# Patient Record
Sex: Male | Born: 1985
Health system: Southern US, Community
[De-identification: ages and names within clinical notes are randomized; demographics above are authoritative.]

## PROBLEM LIST (undated history)

## (undated) DIAGNOSIS — J45909 Unspecified asthma, uncomplicated: Secondary | ICD-10-CM

## (undated) HISTORY — PX: TONSILLECTOMY: SUR1361

---
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
## 2019-01-31 ENCOUNTER — Other Ambulatory Visit: Payer: Self-pay

## 2019-01-31 ENCOUNTER — Ambulatory Visit (HOSPITAL_COMMUNITY)
Admission: EM | Admit: 2019-01-31 | Discharge: 2019-01-31 | Disposition: A | Discharge status: Home / Self Care | Payer: 59 | Attending: Family Medicine | Admitting: Family Medicine

## 2019-01-31 ENCOUNTER — Encounter (HOSPITAL_COMMUNITY): Payer: Self-pay | Admitting: Emergency Medicine

## 2019-01-31 DIAGNOSIS — R1030 Lower abdominal pain, unspecified: Secondary | ICD-10-CM

## 2019-01-31 HISTORY — DX: Unspecified asthma, uncomplicated: J45.909

## 2019-01-31 LAB — POCT URINALYSIS DIP (DEVICE)
Bilirubin Urine: NEGATIVE
Glucose, UA: NEGATIVE mg/dL
Hgb urine dipstick: NEGATIVE
Ketones, ur: NEGATIVE mg/dL
Leukocytes,Ua: NEGATIVE
Nitrite: NEGATIVE
Protein, ur: NEGATIVE mg/dL
Specific Gravity, Urine: <=1.005 (ref 1.005–1.030)
Urobilinogen, UA: 0.2 mg/dL (ref 0.0–1.0)
pH: 6 (ref 5.0–8.0)

## 2019-01-31 MED ORDER — CIPROFLOXACIN HCL 500 MG PO TABS: 500.0000 mg | ORAL_TABLET | Freq: Two times a day (BID) | ORAL | 0 refills | Status: DC

## 2019-01-31 NOTE — ED Triage Notes (Signed)
Pt reports mid lower abdominal cramping with shooting pain that started on Monday.  He reports moments of urgency and frequency and one time he had a little pressure when trying to urinate.

## 2019-01-31 NOTE — ED Provider Notes (Signed)
[place]    CSN: [id number] Arrival date & time: 01/31/19  1433      History   Chief Complaint Chief Complaint  Patient presents with   Abdominal Pain    HPI Hector Elliott is a 33 y.o. male.   HPI  Patient states he has lower abdominal pressure and pain.  Urinary frequency.  Feels like he cannot quite empty his bladder.  He states that he feels some pressure towards his low back.  No fever chills.  He states that he has to push to empty his bladder.  He is never had bladder problems.  Never had sexually transmitted disease.  Happily married.  Never had prostate problems.  Past Medical History:  Diagnosis Date   Asthma    childhood    There are no active problems to display for this patient.   Past Surgical History:  Procedure Laterality Date   TONSILLECTOMY         Home Medications    Prior to Admission medications   Medication Sig Start Date End Date Taking? Authorizing Provider  ciprofloxacin (CIPRO) 500 MG tablet Take 1 tablet (500 mg total) by mouth 2 (two) times daily. 01/31/19   Raylene Everts, MD    Family History Family History  Problem Relation Age of Onset   Hypertension Mother    Healthy Father     Social History Social History   Tobacco Use   Smoking status: Current Every Day Smoker    Packs/day: 1.00    Types: Cigarettes   Smokeless tobacco: Never Used  Substance Use Topics   Alcohol use: Yes   Drug use: Never     Allergies   Patient has no known allergies.   Review of Systems Review of Systems  Constitutional: Negative for chills and fever.  HENT: Negative for ear pain and sore throat.   Eyes: Negative for pain and visual disturbance.  Respiratory: Negative for cough and shortness of breath.   Cardiovascular: Negative for chest pain and palpitations.  Gastrointestinal: Negative for abdominal pain and vomiting.  Genitourinary: Positive for difficulty urinating, frequency and urgency. Negative  for dysuria and hematuria.  Musculoskeletal: Negative for arthralgias and back pain.  Skin: Negative for color change and rash.  Neurological: Negative for seizures and syncope.  All other systems reviewed and are negative.    Physical Exam Triage Vital Signs ED Triage Vitals  Enc Vitals Group     BP 01/31/19 1510 130/87     Pulse Rate 01/31/19 1510 78     Resp 01/31/19 1510 12     Temp 01/31/19 1510 98.6 F (37 C)     Temp Source 01/31/19 1510 Oral     SpO2 01/31/19 1510 100 %     Weight --      Height --      Head Circumference --      Peak Flow --      Pain Score 01/31/19 1513 4     Pain Loc --      Pain Edu? --      Excl. in [place]? --    No data found.  Updated Vital Signs BP 130/87 (BP Location: Right Arm)    Pulse 78    Temp 98.6 F (37 C) (Oral)    Resp 12    SpO2 100%   Visual Acuity Right Eye Distance:   Left Eye Distance:   Bilateral Distance:    Right Eye Near:   Left Eye  Near:    Bilateral Near:     Physical Exam Constitutional:      General: He is not in acute distress.    Appearance: He is well-developed.  HENT:     Head: Normocephalic and atraumatic.     Mouth/Throat:     Mouth: Mucous membranes are moist.  Eyes:     Conjunctiva/sclera: Conjunctivae normal.     Pupils: Pupils are equal, round, and reactive to light.  Neck:     Musculoskeletal: Normal range of motion.  Cardiovascular:     Rate and Rhythm: Normal rate and regular rhythm.     Heart sounds: Normal heart sounds.  Pulmonary:     Effort: Pulmonary effort is normal. No respiratory distress.     Breath sounds: Normal breath sounds.  Abdominal:     General: Abdomen is flat. Bowel sounds are normal. There is no distension.     Palpations: Abdomen is soft.     Tenderness: There is no abdominal tenderness.     Comments: States he feels "pressure" fullness when I palpate suprapubic area  Musculoskeletal: Normal range of motion.  Skin:    General: Skin is warm and dry.  Neurological:      General: No focal deficit present.     Mental Status: He is alert.  Psychiatric:        Mood and Affect: Mood normal.        Behavior: Behavior normal.      UC Treatments / Results  Labs (all labs ordered are listed, but only abnormal results are displayed) Labs Reviewed  POCT URINALYSIS DIP (DEVICE)    EKG   Radiology No results found.  Procedures Procedures (including critical care time)  Medications Ordered in UC Medications - No data to display  Initial Impression / Assessment and Plan / UC Course  I have reviewed the triage vital signs and the nursing notes.  Pertinent labs & imaging results that were available during my care of the patient were reviewed by me and considered in my medical decision making (see chart for details).  Clinical Course as of Jan 31 2115  Thu Jan 31, 2019  1545 POCT Urinalysis, Dipstick [YN]  1550 POCT Urinalysis, Dipstick [YN]    Clinical Course User Index [YN] Eustace MooreNelson, Brevon Dewald Sue, MD    Urinalysis is normal.  His symptoms are most consistent with a prostate infection.  Medical and treated with antibiotics and expect improvement over the next few days.  He is told that he needs to see his PCP or return if not improving by Monday Final Clinical Impressions(s) / UC Diagnoses   Final diagnoses:  Lower abdominal pain     Discharge Instructions     I believe your lower abdominal pain is likely from a urinary tract/prostate I am going to prescribe antibiotics 2 times a day Plenty of fluids and water You should see improvement 2 to 3 days Call if no better by Monday    ED Prescriptions    Medication Sig Dispense Auth. Provider   ciprofloxacin (CIPRO) 500 MG tablet Take 1 tablet (500 mg total) by mouth 2 (two) times daily. 14 tablet Eustace MooreNelson, Lorriane Dehart Sue, MD     Controlled Substance Prescriptions [place] Controlled Substance Registry consulted? Not Applicable   Eustace MooreNelson, Iyania Denne Sue, MD 01/31/19 2118

## 2019-01-31 NOTE — Discharge Instructions (Signed)
I believe your lower abdominal pain is likely from a urinary tract/prostate I am going to prescribe antibiotics 2 times a day Plenty of fluids and water You should see improvement 2 to 3 days Call if no better by Monday

## 2020-01-28 ENCOUNTER — Other Ambulatory Visit: Payer: Self-pay

## 2020-01-28 ENCOUNTER — Ambulatory Visit (INDEPENDENT_AMBULATORY_CARE_PROVIDER_SITE_OTHER): Payer: No Typology Code available for payment source | Admitting: Family

## 2020-01-28 ENCOUNTER — Encounter: Payer: Self-pay | Admitting: Family

## 2020-01-28 VITALS — BP 128/75 | HR 80 | Temp 98.1°F | Ht 72.0 in | Wt 196.0 lb

## 2020-01-28 DIAGNOSIS — Z0000 Encounter for general adult medical examination without abnormal findings: Secondary | ICD-10-CM

## 2020-01-28 DIAGNOSIS — E663 Overweight: Secondary | ICD-10-CM | POA: Diagnosis not present

## 2020-01-28 DIAGNOSIS — Z0001 Encounter for general adult medical examination with abnormal findings: Secondary | ICD-10-CM | POA: Diagnosis not present

## 2020-01-28 DIAGNOSIS — F17200 Nicotine dependence, unspecified, uncomplicated: Secondary | ICD-10-CM

## 2020-01-28 NOTE — Progress Notes (Signed)
Subjective:    Patient ID: Hector Elliott, male    DOB: 1986/01/31, 34 y.o.   MRN: [id number]  Chief Complaint  Patient presents with  . Establish Care    HPI  Pt presents to the office today to establish care. Pt currently not taking any medications. Pt denies any headache, palpitations, SOB, or edema at this time.   Denies TDAP today.    Review of Systems  All other systems reviewed and are negative.   Family History  Problem Relation Age of Onset  . Hypertension Mother   . Healthy Father    Social History   Socioeconomic History  . Marital status: Married    Spouse name: Not on file  . Number of children: 1  . Years of education: Not on file  . Highest education level: Not on file  Occupational History  . Not on file  Tobacco Use  . Smoking status: Current Every Day Smoker    Packs/day: 1.00    Types: Cigarettes  . Smokeless tobacco: Never Used  Vaping Use  . Vaping Use: Never used  Substance and Sexual Activity  . Alcohol use: Yes    Alcohol/week: 5.0 standard drinks    Types: 5 Cans of beer per week  . Drug use: Never  . Sexual activity: Not on file  Other Topics Concern  . Not on file  Social History Narrative  . Not on file   Social Determinants of Health   Financial Resource Strain:   . Difficulty of Paying Living Expenses:   Food Insecurity:   . Worried About Charity fundraiser in the Last Year:   . Arboriculturist in the Last Year:   Transportation Needs:   . Film/video editor (Medical):   Marland Kitchen Lack of Transportation (Non-Medical):   Physical Activity:   . Days of Exercise per Week:   . Minutes of Exercise per Session:   Stress:   . Feeling of Stress :   Social Connections:   . Frequency of Communication with Friends and Family:   . Frequency of Social Gatherings with Friends and Family:   . Attends Religious Services:   . Active Member of Clubs or Organizations:   . Attends Archivist Meetings:   Marland Kitchen Marital  Status:        Objective:   Physical Exam Vitals reviewed.  Constitutional:      General: He is not in acute distress.    Appearance: He is well-developed.  HENT:     Head: Normocephalic.     Right Ear: Tympanic membrane normal.     Left Ear: Tympanic membrane normal.  Eyes:     General:        Right eye: No discharge.        Left eye: No discharge.     Pupils: Pupils are equal, round, and reactive to light.  Neck:     Thyroid: No thyromegaly.  Cardiovascular:     Rate and Rhythm: Normal rate and regular rhythm.     Heart sounds: Normal heart sounds. No murmur heard.   Pulmonary:     Effort: Pulmonary effort is normal. No respiratory distress.     Breath sounds: Normal breath sounds. No wheezing.  Abdominal:     General: Bowel sounds are normal. There is no distension.     Palpations: Abdomen is soft.     Tenderness: There is no abdominal tenderness.  Musculoskeletal:  General: No tenderness. Normal range of motion.     Cervical back: Normal range of motion and neck supple.  Skin:    General: Skin is warm and dry.     Findings: No erythema or rash.  Neurological:     Mental Status: He is alert and oriented to person, place, and time.     Cranial Nerves: No cranial nerve deficit.     Deep Tendon Reflexes: Reflexes are normal and symmetric.  Psychiatric:        Behavior: Behavior normal.        Thought Content: Thought content normal.        Judgment: Judgment normal.        BP 128/75   Pulse 80   Temp 98.1 F (36.7 C)   Ht 6' (1.829 m)   Wt 196 lb (88.9 kg)   SpO2 99%   BMI 26.58 kg/m   Assessment & Plan:  Hector Elliott comes in today with chief complaint of Establish Care   Diagnosis and orders addressed:  1. Annual physical exam - CMP14+EGFR - CBC with Differential/Platelet - Lipid panel - PSA, total and free - TSH - Testosterone,Free and Total  2. Current smoker Smoking cessation discussed - CMP14+EGFR  3. Overweight (BMI  25.0-29.9) - CMP14+EGFR   Labs pending Health Maintenance reviewed Diet and exercise encouraged  Follow up plan: 1 year    Evelina Dun, FNP

## 2020-01-28 NOTE — Patient Instructions (Signed)

## 2020-02-01 LAB — LIPID PANEL
Chol/HDL Ratio: 5 ratio (ref 0.0–5.0)
Cholesterol, Total: 174 mg/dL (ref 100–199)
HDL: 35 mg/dL — ABNORMAL LOW (ref >39)
LDL Chol Calc (NIH): 100 mg/dL — ABNORMAL HIGH (ref 0–99)
Triglycerides: 224 mg/dL — ABNORMAL HIGH (ref 0–149)
VLDL Cholesterol Cal: 39 mg/dL (ref 5–40)

## 2020-02-01 LAB — CBC WITH DIFFERENTIAL/PLATELET
Basophils Absolute: 0.1 10*3/uL (ref 0.0–0.2)
Basos: 1 %
EOS (ABSOLUTE): 0.1 10*3/uL (ref 0.0–0.4)
Eos: 2 %
Hematocrit: 53.7 % — ABNORMAL HIGH (ref 37.5–51.0)
Hemoglobin: 18.4 g/dL — ABNORMAL HIGH (ref 13.0–17.7)
Immature Grans (Abs): 0 10*3/uL (ref 0.0–0.1)
Immature Granulocytes: 0 %
Lymphocytes Absolute: 2.8 10*3/uL (ref 0.7–3.1)
Lymphs: 31 %
MCH: 30.4 pg (ref 26.6–33.0)
MCHC: 34.3 g/dL (ref 31.5–35.7)
MCV: 89 fL (ref 79–97)
Monocytes Absolute: 0.8 10*3/uL (ref 0.1–0.9)
Monocytes: 8 %
Neutrophils Absolute: 5.2 10*3/uL (ref 1.4–7.0)
Neutrophils: 58 %
Platelets: 256 10*3/uL (ref 150–450)
RBC: 6.05 x10E6/uL — ABNORMAL HIGH (ref 4.14–5.80)
RDW: 12.7 % (ref 11.6–15.4)
WBC: 9 10*3/uL (ref 3.4–10.8)

## 2020-02-01 LAB — CMP14+EGFR
ALT: 27 IU/L (ref 0–44)
AST: 20 IU/L (ref 0–40)
Albumin/Globulin Ratio: 1.8 (ref 1.2–2.2)
Albumin: 4.5 g/dL (ref 4.0–5.0)
Alkaline Phosphatase: 143 IU/L — ABNORMAL HIGH (ref 48–121)
BUN/Creatinine Ratio: 11 (ref 9–20)
BUN: 12 mg/dL (ref 6–20)
Bilirubin Total: 0.4 mg/dL (ref 0.0–1.2)
CO2: 22 mmol/L (ref 20–29)
Calcium: 9.7 mg/dL (ref 8.7–10.2)
Chloride: 102 mmol/L (ref 96–106)
Creatinine, Ser: 1.13 mg/dL (ref 0.76–1.27)
GFR calc Af Amer: 99 mL/min/{1.73_m2} (ref >59)
GFR calc non Af Amer: 86 mL/min/{1.73_m2} (ref >59)
Globulin, Total: 2.5 g/dL (ref 1.5–4.5)
Glucose: 98 mg/dL (ref 65–99)
Potassium: 4.3 mmol/L (ref 3.5–5.2)
Sodium: 139 mmol/L (ref 134–144)
Total Protein: 7 g/dL (ref 6.0–8.5)

## 2020-02-01 LAB — TESTOSTERONE,FREE AND TOTAL
Testosterone, Free: 12.4 pg/mL (ref 8.7–25.1)
Testosterone: 1060 ng/dL — ABNORMAL HIGH (ref 264–916)

## 2020-02-01 LAB — PSA, TOTAL AND FREE
PSA, Free Pct: 38.6 %
PSA, Free: 0.27 ng/mL
Prostate Specific Ag, Serum: 0.7 ng/mL (ref 0.0–4.0)

## 2020-02-01 LAB — TSH: TSH: 2.78 u[IU]/mL (ref 0.450–4.500)

## 2020-10-06 ENCOUNTER — Ambulatory Visit: Payer: No Typology Code available for payment source | Admitting: Family Medicine

## 2020-10-07 ENCOUNTER — Other Ambulatory Visit: Payer: Self-pay

## 2020-10-07 ENCOUNTER — Ambulatory Visit (INDEPENDENT_AMBULATORY_CARE_PROVIDER_SITE_OTHER): Payer: No Typology Code available for payment source

## 2020-10-07 ENCOUNTER — Ambulatory Visit (INDEPENDENT_AMBULATORY_CARE_PROVIDER_SITE_OTHER): Payer: No Typology Code available for payment source | Admitting: Family Medicine

## 2020-10-07 VITALS — BP 131/76 | HR 91 | Temp 98.0°F | Ht 72.0 in | Wt 197.0 lb

## 2020-10-07 DIAGNOSIS — M25512 Pain in left shoulder: Secondary | ICD-10-CM

## 2020-10-07 DIAGNOSIS — G8929 Other chronic pain: Secondary | ICD-10-CM

## 2020-10-07 NOTE — Progress Notes (Signed)
   Assessment & Plan:  1. Chronic left shoulder pain Exercises provided for patient to complete at home as he is unable to complete physical therapy in an office due to his work schedule.  Ibuprofen scheduled for pain relief.  Discussed he may need a MRI in the future but we need to try physical therapy first and get an x-ray today. - DG Shoulder Left   Follow up plan: Return in about 6 weeks (around 11/18/2020) for f/u shoulder pain.  Deliah Boston, MSN, APRN, FNP-C [facility]  Subjective:   Patient ID: Hector Elliott, male    DOB: [birth year], 35 y.o.   MRN: [id number]  HPI: Hector Elliott is a 35 y.o. male presenting on 10/07/2020 for Shoulder Pain  Patient reports left shoulder pain for the past 6 months.  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  States he has been hard on his body and has had many injuries to his shoulders in the past.  Reports he has torn something in his shoulder in the past as well.  Most days describes the pain as a dull nagging pain.  Past 2 days he has had a sharp pain that goes from his shoulder to his neck when he turns his head to the right.  He took ibuprofen yesterday which he feels reducing swelling he was having but did not help with the pain.  He reports he is back to his baseline pain today.   ROS: Negative unless specifically indicated above in HPI.   Relevant past medical history reviewed and updated as indicated.   Allergies and medications reviewed and updated.  No current outpatient medications on file.  No Known Allergies  Objective:   There were no vitals taken for this visit.   Physical Exam Vitals reviewed.  Constitutional:      General: He is not in acute distress.    Appearance: Normal appearance. He is not ill-appearing, toxic-appearing or diaphoretic.  HENT:     Head: Normocephalic and atraumatic.  Eyes:     General: No scleral icterus.       Right eye: No discharge.         Left eye: No discharge.     Conjunctiva/sclera: Conjunctivae normal.  Cardiovascular:     Rate and Rhythm: Normal rate.  Pulmonary:     Effort: Pulmonary effort is normal. No respiratory distress.  Musculoskeletal:        General: Normal range of motion.     Left shoulder: Bony tenderness and crepitus present. No swelling, deformity, effusion, laceration or tenderness. Normal range of motion. Normal strength. Normal pulse.     Cervical back: Normal range of motion.  Skin:    General: Skin is warm and dry.  Neurological:     Mental Status: He is alert and oriented to person, place, and time. Mental status is at baseline.  Psychiatric:        Mood and Affect: Mood normal.        Behavior: Behavior normal.        Thought Content: Thought content normal.        Judgment: Judgment normal.

## 2020-10-07 NOTE — Patient Instructions (Signed)
Shoulder Exercises Ask your health care provider which exercises are safe for you. Do exercises exactly as told by your health care provider and adjust them as directed. It is normal to feel mild stretching, pulling, tightness, or discomfort as you do these exercises. Stop right away if you feel sudden pain or your pain gets worse. Do not begin these exercises until told by your health care provider. Stretching exercises External rotation and abduction This exercise is sometimes called corner stretch. This exercise rotates your arm outward (external rotation) and moves your arm out from your body (abduction). 1. Stand in a doorway with one of your feet slightly in front of the other. This is called a staggered stance. If you cannot reach your forearms to the door frame, stand facing a corner of a room. 2. Choose one of the following positions as told by your health care provider: ? Place your hands and forearms on the door frame above your head. ? Place your hands and forearms on the door frame at the height of your head. ? Place your hands on the door frame at the height of your elbows. 3. Slowly move your weight onto your front foot until you feel a stretch across your chest and in the front of your shoulders. Keep your head and chest upright and keep your abdominal muscles tight. 4. Hold for __________ seconds. 5. To release the stretch, shift your weight to your back foot. Repeat __________ times. Complete this exercise __________ times a day.   Extension, standing 1. Stand and hold a broomstick, a cane, or a similar object behind your back. ? Your hands should be a little wider than shoulder width apart. ? Your palms should face away from your back. 2. Keeping your elbows straight and your shoulder muscles relaxed, move the stick away from your body until you feel a stretch in your shoulders (extension). ? Avoid shrugging your shoulders while you move the stick. Keep your shoulder blades  tucked down toward the middle of your back. 3. Hold for __________ seconds. 4. Slowly return to the starting position. Repeat __________ times. Complete this exercise __________ times a day. Range-of-motion exercises Pendulum 1. Stand near a wall or a surface that you can hold onto for balance. 2. Bend at the waist and let your left / right arm hang straight down. Use your other arm to support you. Keep your back straight and do not lock your knees. 3. Relax your left / right arm and shoulder muscles, and move your hips and your trunk so your left / right arm swings freely. Your arm should swing because of the motion of your body, not because you are using your arm or shoulder muscles. 4. Keep moving your hips and trunk so your arm swings in the following directions, as told by your health care provider: ? Side to side. ? Forward and backward. ? In clockwise and counterclockwise circles. 5. Continue each motion for __________ seconds, or for as long as told by your health care provider. 6. Slowly return to the starting position. Repeat __________ times. Complete this exercise __________ times a day.   Shoulder flexion, standing 1. Stand and hold a broomstick, a cane, or a similar object. Place your hands a little more than shoulder width apart on the object. Your left / right hand should be palm up, and your other hand should be palm down. 2. Keep your elbow straight and your shoulder muscles relaxed. Push the stick up with your healthy   arm to raise your left / right arm in front of your body, and then over your head until you feel a stretch in your shoulder (flexion). ? Avoid shrugging your shoulder while you raise your arm. Keep your shoulder blade tucked down toward the middle of your back. 3. Hold for __________ seconds. 4. Slowly return to the starting position. Repeat __________ times. Complete this exercise __________ times a day.   Shoulder abduction, standing 1. Stand and hold a  broomstick, a cane, or a similar object. Place your hands a little more than shoulder width apart on the object. Your left / right hand should be palm up, and your other hand should be palm down. 2. Keep your elbow straight and your shoulder muscles relaxed. Push the object across your body toward your left / right side. Raise your left / right arm to the side of your body (abduction) until you feel a stretch in your shoulder. ? Do not raise your arm above shoulder height unless your health care provider tells you to do that. ? If directed, raise your arm over your head. ? Avoid shrugging your shoulder while you raise your arm. Keep your shoulder blade tucked down toward the middle of your back. 3. Hold for __________ seconds. 4. Slowly return to the starting position. Repeat __________ times. Complete this exercise __________ times a day. Internal rotation 1. Place your left / right hand behind your back, palm up. 2. Use your other hand to dangle an exercise band, a towel, or a similar object over your shoulder. Grasp the band with your left / right hand so you are holding on to both ends. 3. Gently pull up on the band until you feel a stretch in the front of your left / right shoulder. The movement of your arm toward the center of your body is called internal rotation. ? Avoid shrugging your shoulder while you raise your arm. Keep your shoulder blade tucked down toward the middle of your back. 4. Hold for __________ seconds. 5. Release the stretch by letting go of the band and lowering your hands. Repeat __________ times. Complete this exercise __________ times a day.   Strengthening exercises External rotation 1. Sit in a stable chair without armrests. 2. Secure an exercise band to a stable object at elbow height on your left / right side. 3. Place a soft object, such as a folded towel or a small pillow, between your left / right upper arm and your body to move your elbow about 4 inches (10  cm) away from your side. 4. Hold the end of the exercise band so it is tight and there is no slack. 5. Keeping your elbow pressed against the soft object, slowly move your forearm out, away from your abdomen (external rotation). Keep your body steady so only your forearm moves. 6. Hold for __________ seconds. 7. Slowly return to the starting position. Repeat __________ times. Complete this exercise __________ times a day.   Shoulder abduction 1. Sit in a stable chair without armrests, or stand up. 2. Hold a __________ weight in your left / right hand, or hold an exercise band with both hands. 3. Start with your arms straight down and your left / right palm facing in, toward your body. 4. Slowly lift your left / right hand out to your side (abduction). Do not lift your hand above shoulder height unless your health care provider tells you that this is safe. ? Keep your arms straight. ? Avoid   shrugging your shoulder while you do this movement. Keep your shoulder blade tucked down toward the middle of your back. 5. Hold for __________ seconds. 6. Slowly lower your arm, and return to the starting position. Repeat __________ times. Complete this exercise __________ times a day.   Shoulder extension 1. Sit in a stable chair without armrests, or stand up. 2. Secure an exercise band to a stable object in front of you so it is at shoulder height. 3. Hold one end of the exercise band in each hand. Your palms should face each other. 4. Straighten your elbows and lift your hands up to shoulder height. 5. Step back, away from the secured end of the exercise band, until the band is tight and there is no slack. 6. Squeeze your shoulder blades together as you pull your hands down to the sides of your thighs (extension). Stop when your hands are straight down by your sides. Do not let your hands go behind your body. 7. Hold for __________ seconds. 8. Slowly return to the starting position. Repeat __________  times. Complete this exercise __________ times a day. Shoulder row 1. Sit in a stable chair without armrests, or stand up. 2. Secure an exercise band to a stable object in front of you so it is at waist height. 3. Hold one end of the exercise band in each hand. Position your palms so that your thumbs are facing the ceiling (neutral position). 4. Bend each of your elbows to a 90-degree angle (right angle) and keep your upper arms at your sides. 5. Step back until the band is tight and there is no slack. 6. Slowly pull your elbows back behind you. 7. Hold for __________ seconds. 8. Slowly return to the starting position. Repeat __________ times. Complete this exercise __________ times a day. Shoulder press-ups 1. Sit in a stable chair that has armrests. Sit upright, with your feet flat on the floor. 2. Put your hands on the armrests so your elbows are bent and your fingers are pointing forward. Your hands should be about even with the sides of your body. 3. Push down on the armrests and use your arms to lift yourself off the chair. Straighten your elbows and lift yourself up as much as you comfortably can. ? Move your shoulder blades down, and avoid letting your shoulders move up toward your ears. ? Keep your feet on the ground. As you get stronger, your feet should support less of your body weight as you lift yourself up. 4. Hold for __________ seconds. 5. Slowly lower yourself back into the chair. Repeat __________ times. Complete this exercise __________ times a day.   Wall push-ups 1. Stand so you are facing a stable wall. Your feet should be about one arm-length away from the wall. 2. Lean forward and place your palms on the wall at shoulder height. 3. Keep your feet flat on the floor as you bend your elbows and lean forward toward the wall. 4. Hold for __________ seconds. 5. Straighten your elbows to push yourself back to the starting position. Repeat __________ times. Complete this  exercise __________ times a day.   This information is not intended to replace advice given to you by your health care provider. Make sure you discuss any questions you have with your health care provider. Document Revised: 11/09/2018 Document Reviewed: 08/17/2018 Elsevier Patient Education  2021 [employer]  

## 2022-04-13 ENCOUNTER — Telehealth: Payer: 59 | Admitting: Physician Assistant

## 2022-04-13 DIAGNOSIS — J019 Acute sinusitis, unspecified: Secondary | ICD-10-CM | POA: Diagnosis not present

## 2022-04-13 DIAGNOSIS — B9789 Other viral agents as the cause of diseases classified elsewhere: Secondary | ICD-10-CM | POA: Diagnosis not present

## 2022-04-13 MED ORDER — AZELASTINE HCL 0.1 % NA SOLN: 1.0000 | Freq: Two times a day (BID) | NASAL | 0 refills | Status: AC

## 2022-04-13 MED ORDER — PREDNISONE 10 MG (21) PO TBPK: ORAL_TABLET | ORAL | 0 refills | Status: AC

## 2022-04-13 NOTE — Addendum Note (Signed)
Addended by: Waldon Merl on: 04/13/2022 08:56 AM   Modules accepted: Orders

## 2022-04-13 NOTE — Progress Notes (Signed)
I have spent 5 minutes in review of e-visit questionnaire, review and updating patient chart, medical decision making and response to patient.   Lamis Behrmann Cody Reice Bienvenue, PA-C    

## 2022-04-13 NOTE — Progress Notes (Signed)

## 2022-04-28 IMAGING — DX DG SHOULDER 2+V*L*
3 series · 3 of 3 positions shown · non-contrast
Comparison: None.

CLINICAL DATA: Left shoulder pain, no known injury, initial
encounter

EXAM:
LEFT SHOULDER - 2+ VIEW

[shoulder ap (1 of 3)]
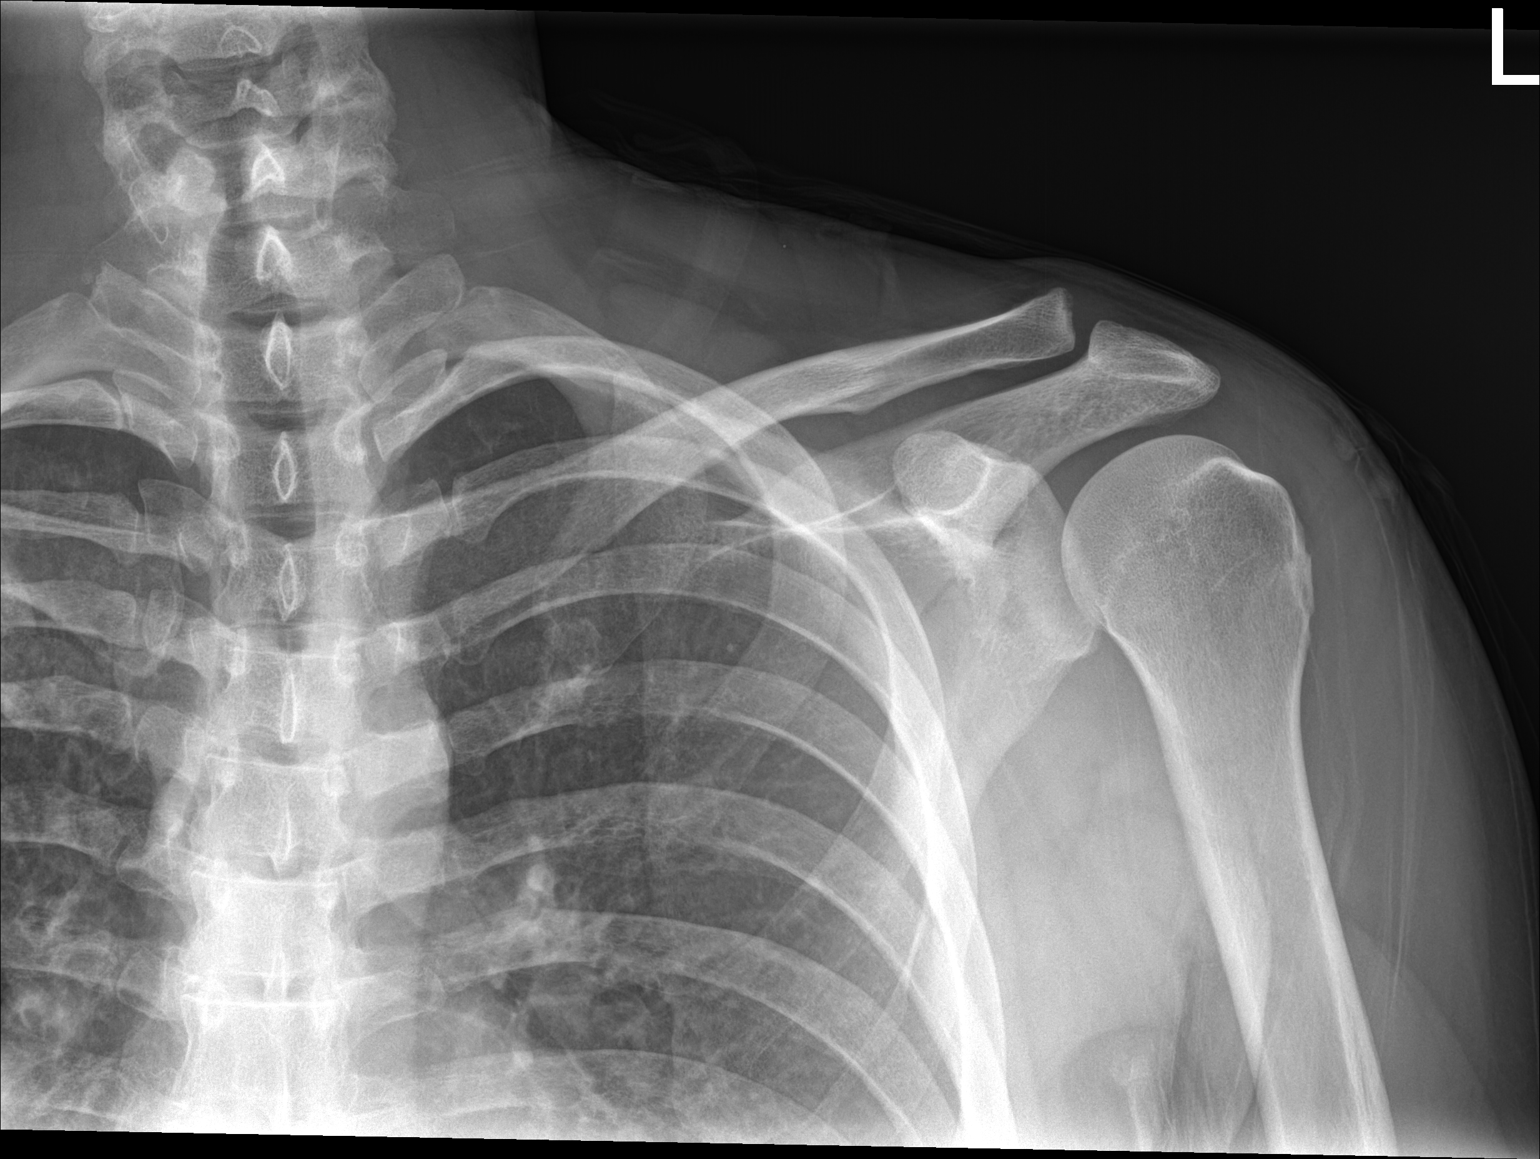

[shoulder ap (2 of 3)]
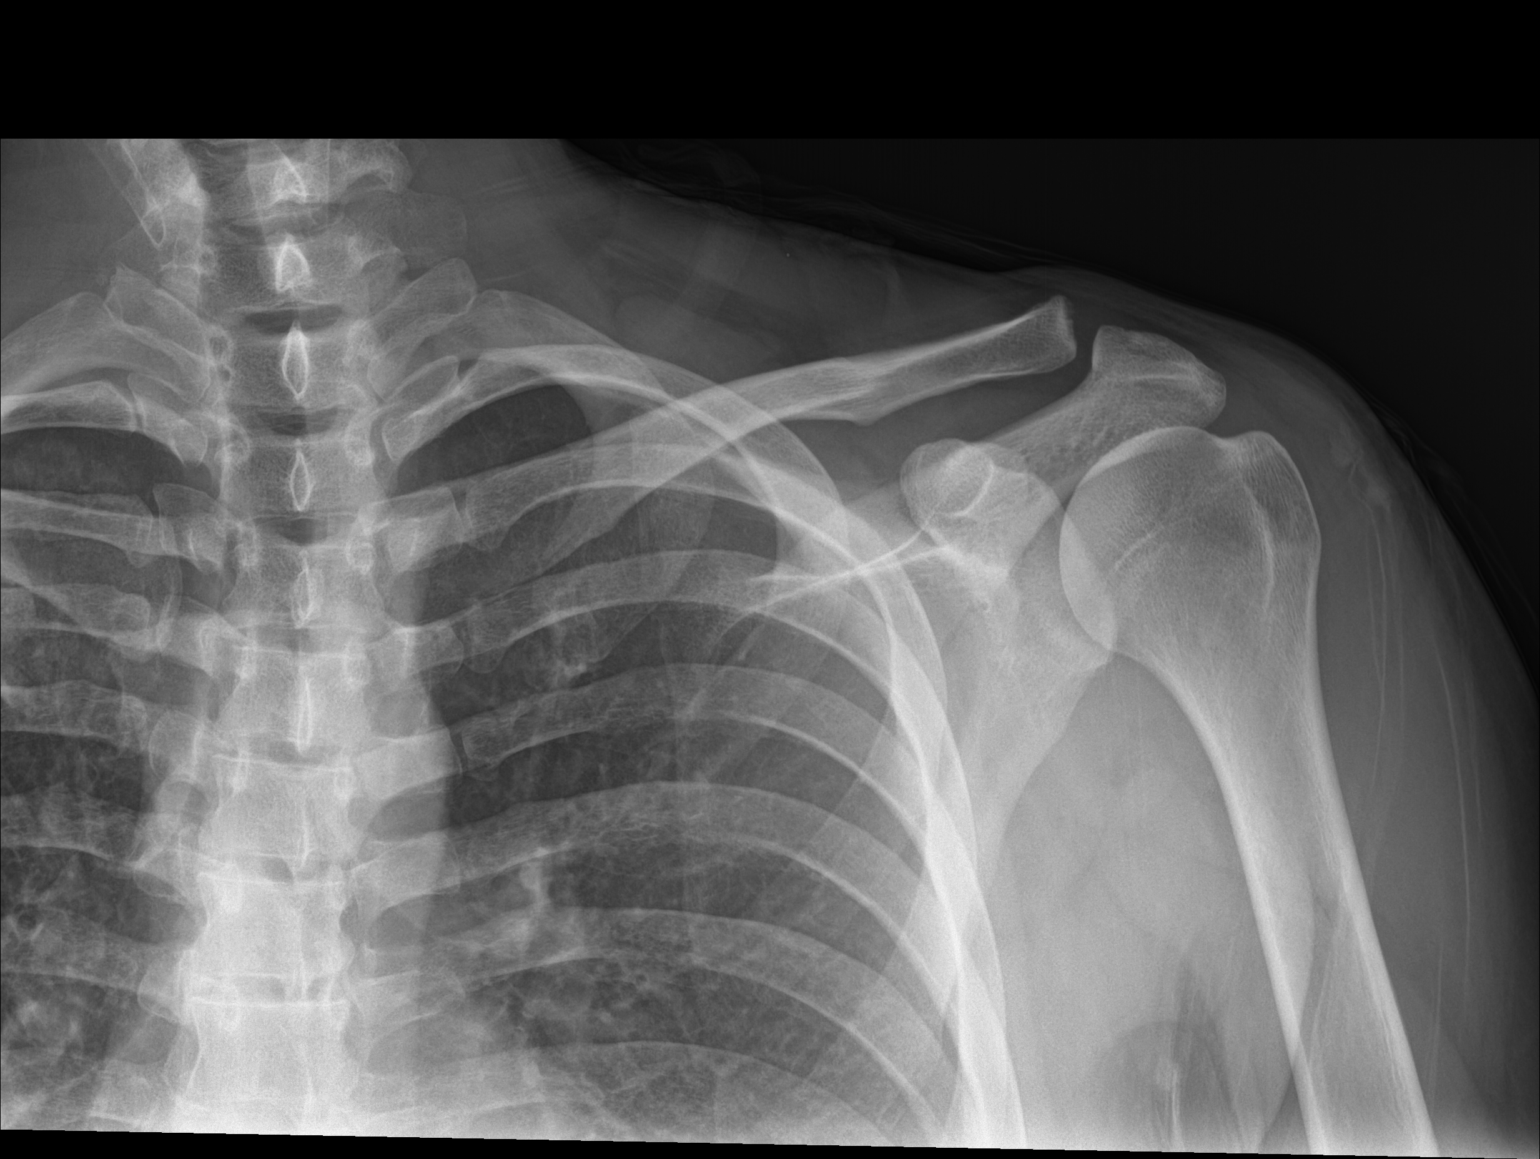

[shoulder ap (3 of 3)]
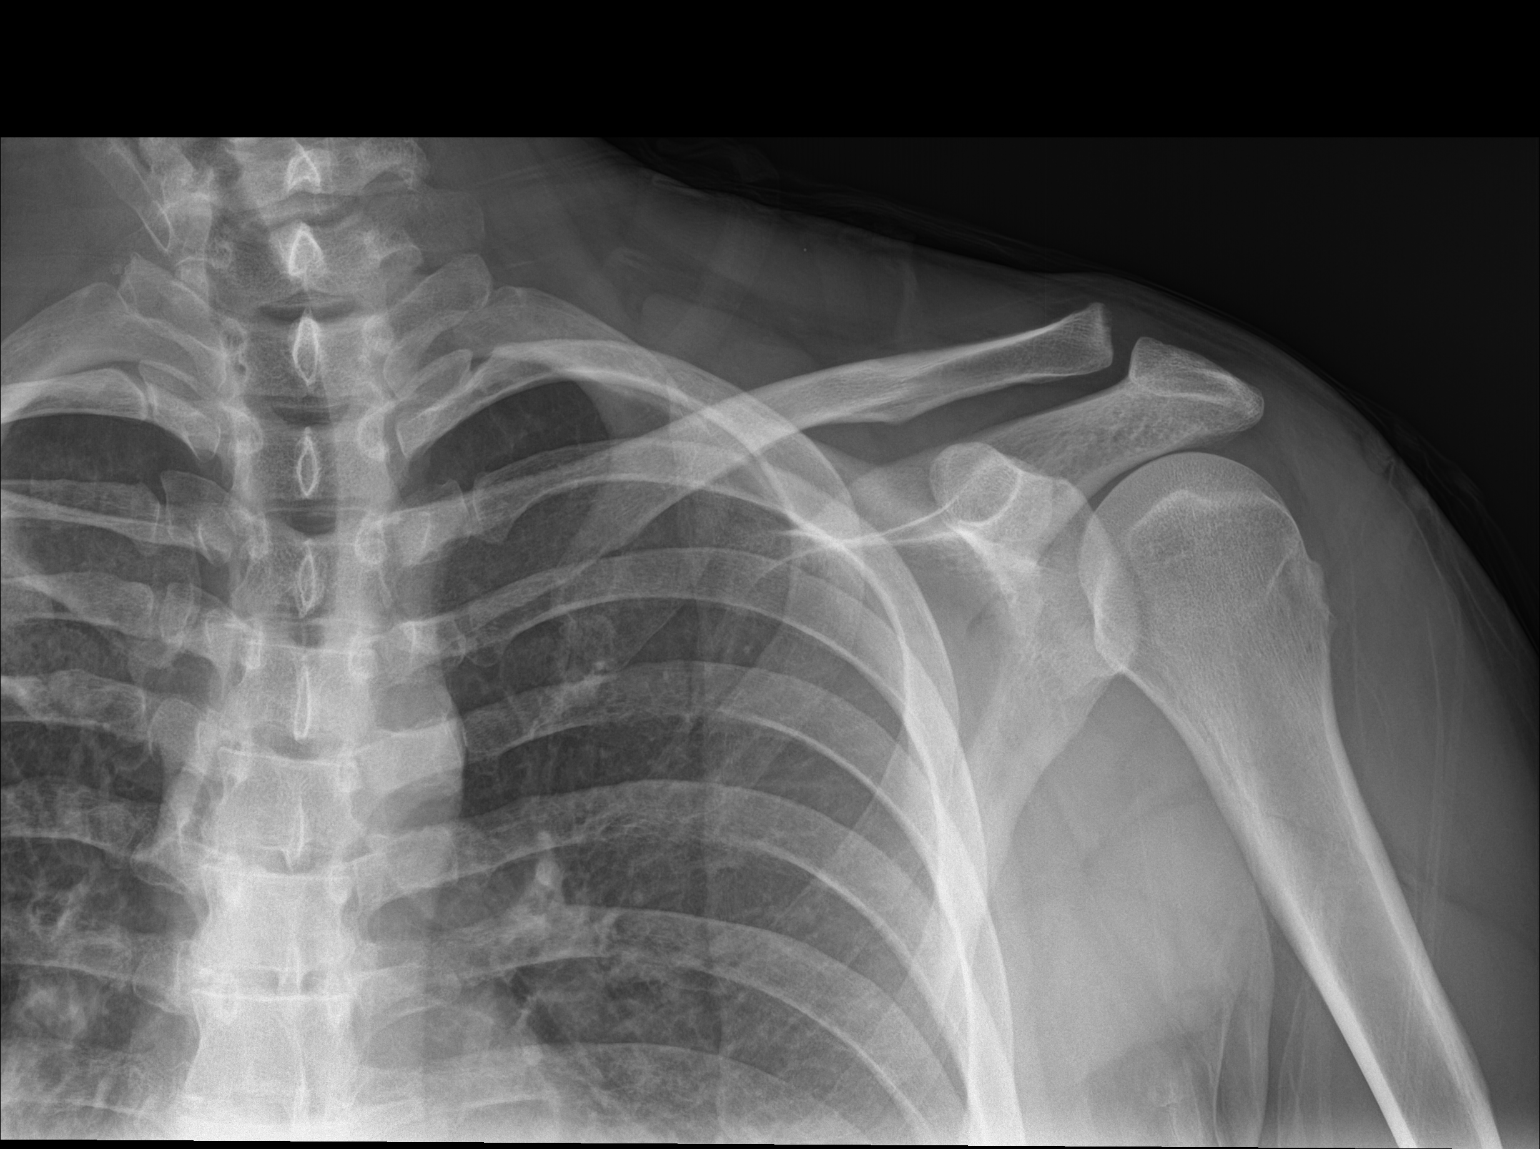

[3 of 3 positions shown; findings below may reference images not displayed]

FINDINGS: There is no evidence of fracture or dislocation. There is no
evidence of arthropathy or other focal bone abnormality. Soft
tissues are unremarkable.
IMPRESSION: No acute abnormality noted.
